# Patient Record
Sex: Female | Born: 1950 | Race: White | Hispanic: No | Marital: Single | Smoking: Never smoker
Health system: Southern US, Community
[De-identification: ages and names within clinical notes are randomized; demographics above are authoritative.]

## PROBLEM LIST (undated history)

## (undated) HISTORY — PX: KNEE SURGERY: SHX244

## (undated) HISTORY — PX: HAND SURGERY: SHX662

---

## 2017-05-25 ENCOUNTER — Emergency Department
Admission: EM | Admit: 2017-05-25 | Discharge: 2017-05-25 | Disposition: A | Discharge status: Home / Self Care | Payer: Medicare Other | Attending: Emergency Medicine | Admitting: Emergency Medicine

## 2017-05-25 ENCOUNTER — Other Ambulatory Visit: Payer: Self-pay

## 2017-05-25 ENCOUNTER — Emergency Department: Payer: Medicare Other

## 2017-05-25 ENCOUNTER — Encounter: Payer: Self-pay | Admitting: Emergency Medicine

## 2017-05-25 DIAGNOSIS — Y999 Unspecified external cause status: Secondary | ICD-10-CM | POA: Insufficient documentation

## 2017-05-25 DIAGNOSIS — W010XXA Fall on same level from slipping, tripping and stumbling without subsequent striking against object, initial encounter: Secondary | ICD-10-CM | POA: Diagnosis not present

## 2017-05-25 DIAGNOSIS — Y92008 Other place in unspecified non-institutional (private) residence as the place of occurrence of the external cause: Secondary | ICD-10-CM | POA: Insufficient documentation

## 2017-05-25 DIAGNOSIS — Y939 Activity, unspecified: Secondary | ICD-10-CM | POA: Insufficient documentation

## 2017-05-25 DIAGNOSIS — S52502A Unspecified fracture of the lower end of left radius, initial encounter for closed fracture: Secondary | ICD-10-CM | POA: Diagnosis not present

## 2017-05-25 DIAGNOSIS — S6992XA Unspecified injury of left wrist, hand and finger(s), initial encounter: Secondary | ICD-10-CM | POA: Diagnosis present

## 2017-05-25 NOTE — ED Triage Notes (Signed)
Pt tripped on broom handle today and fell. Wrist deformity.

## 2017-05-25 NOTE — ED Notes (Signed)
[facility]

## 2017-05-25 NOTE — ED Notes (Signed)
Pt being transported to Dr Hyacinth MeekerMiller office by hospital courtesy car

## 2017-05-25 NOTE — ED Provider Notes (Signed)
[facility] Emergency Department Provider Note  ____________________________________________  Time seen: Approximately 11:50 AM  I have reviewed the triage vital signs and the nursing notes.   HISTORY  Chief Complaint Wrist Pain    HPI Royetta CrochetSuzy Obenchain is a 66 y.o. female who reports a trip and fall in her garage today. She fell onto her outstretched left hand resulting in severe pain right away. No other injuries. No paresthesias or weakness or coldness in the fingertips.     History reviewed. No pertinent past medical history.   There are no active problems to display for this patient.    Past Surgical History:  Procedure Laterality Date  . HAND SURGERY    . KNEE SURGERY       Prior to Admission medications   Not on File  None   Allergies Patient has no known allergies.   History reviewed. No pertinent family history.  Social History Social History   Tobacco Use  . Smoking status: Never Smoker  . Smokeless tobacco: Never Used  Substance Use Topics  . Alcohol use: Yes  . Drug use: No    Review of Systems  Constitutional:   No fever or chills.  ENT:   No sore throat. No rhinorrhea. Cardiovascular:   No chest pain or syncope. Respiratory:   No dyspnea or cough. Gastrointestinal:   Negative for abdominal pain, vomiting and diarrhea.  Musculoskeletal:   Left wrist pain as above All other systems reviewed and are negative except as documented above in ROS and HPI.  ____________________________________________   PHYSICAL EXAM:  VITAL SIGNS: ED Triage Vitals  Enc Vitals Group     BP 05/25/17 0939 (!) 133/108     Pulse Rate 05/25/17 0939 66     Resp 05/25/17 0939 18     Temp 05/25/17 0939 (!) 97.5 F (36.4 C)     Temp Source 05/25/17 0939 Oral     SpO2 05/25/17 0939 100 %     Weight 05/25/17 0937 142 lb (64.4 kg)     Height 05/25/17 0937 5\' 4"  (1.626 m)     Head Circumference --      Peak Flow --      Pain Score  05/25/17 0937 10     Pain Loc --      Pain Edu? --      Excl. in GC? --     Vital signs reviewed, nursing assessments reviewed.   Constitutional:   Alert and oriented. Well appearing and in no distress. Eyes:   No scleral icterus.  EOMI. Marland Kitchen. ENT   Head:   Normocephalic and atraumatic.   Nose:   No congestion/rhinnorhea.    Mouth/Throat:   MMM, no pharyngeal erythema. No peritonsillar mass.    Neck:   No meningismus. Full ROM. Hematological/Lymphatic/Immunilogical:   No cervical lymphadenopathy. Musculoskeletal:   Normal range of motion in all extremities. No joint effusions.  No lower extremity tenderness.  No edema. Neurologic:   Normal speech and language.  Motor grossly intact. No gross focal neurologic deficits are appreciated.  Skin:    Skin is warm, dry and intact. No rash noted.  No petechiae, purpura, or bullae. No lacerations  ____________________________________________    LABS (pertinent positives/negatives) (all labs ordered are listed, but only abnormal results are displayed) Labs Reviewed - No data to display ____________________________________________   EKG    ____________________________________________    RADIOLOGY  Dg Wrist Complete Left  Result Date: 05/25/2017 CLINICAL DATA:  Left wrist  pain, deformity and bruising after falling today in her garage. EXAM: LEFT WRIST - COMPLETE 3+ VIEW COMPARISON:  None. FINDINGS: Mildly comminuted impacted distal radial metaphysis fracture with dorsal angulation and mild dorsal displacement of the distal fragment. The distal ulna is intact. Fixation screw in the thumb, bridging the interphalangeal joint. IMPRESSION: Mildly comminuted and impacted distal radial metaphysis fracture, as described above. No intra-articular extension seen. Electronically Signed   By: Beckie SaltsSteven  Reid M.D.   On: 05/25/2017 10:19    ____________________________________________   PROCEDURES Procedures SPLINT  APPLICATION Date/Time: 11:54 AM Authorized by: Scotty CourtSTAFFORD, Aymen Widrig Consent: Verbal consent obtained. Risks and benefits: risks, benefits and alternatives were discussed Consent given by: patient Splint applied by: orthopedic technician Location details: left forearm Splint type: volar Supplies used: orthoglass Post-procedure: The splinted body part was neurovascularly unchanged following the procedure. Patient tolerance: Patient tolerated the procedure well with no immediate complications.    ____________________________________________    CLINICAL IMPRESSION / ASSESSMENT AND PLAN / ED COURSE  Pertinent labs & imaging results that were available during my care of the patient were reviewed by me and considered in my medical decision making (see chart for details).     Clinical Course as of May 25 1149  Thu May 25, 2017  [id number] Clinical left distal forearm fx. Offered pain meds, pt declines at this time until after xray helps determine nature of injury.  [PS]    Clinical Course User Index [PS] Sharman CheekStafford, Amear Strojny, MD     ----------------------------------------- 11:54 AM on 05/25/2017 -----------------------------------------  Found to have radial metaphysis fracture distally. Mildly comminuted. Discussed with Dr. Hyacinth MeekerMiller of orthopedics, recommends volar splint, will see in clinic at 1 PM today for reduction and more definitive management. Initial fracture management provided for now, clinic information provided to patient.  ____________________________________________   FINAL CLINICAL IMPRESSION(S) / ED DIAGNOSES    Final diagnoses:  Closed fracture of distal end of left radius, unspecified fracture morphology, initial encounter      This SmartLink is deprecated. Use AVSMEDLIST instead to display the medication list for a patient.   Portions of this note were generated with dragon dictation software. Dictation errors may occur despite best attempts at  proofreading.    Sharman CheekStafford, Joshia Kitchings, MD 05/25/17 1155

## 2017-05-25 NOTE — ED Notes (Signed)
Offered to put IV for pain meds. Pt declined at this time and does not want pain medication.

## 2018-07-25 IMAGING — DX DG WRIST COMPLETE 3+V*L*
4 series · 4 of 4 positions shown · non-contrast
Comparison: None.

CLINICAL DATA: Left wrist pain, deformity and bruising after
falling today in her garage.

EXAM:
LEFT WRIST - COMPLETE 3+ VIEW

[wrist ap (1 of 2)]
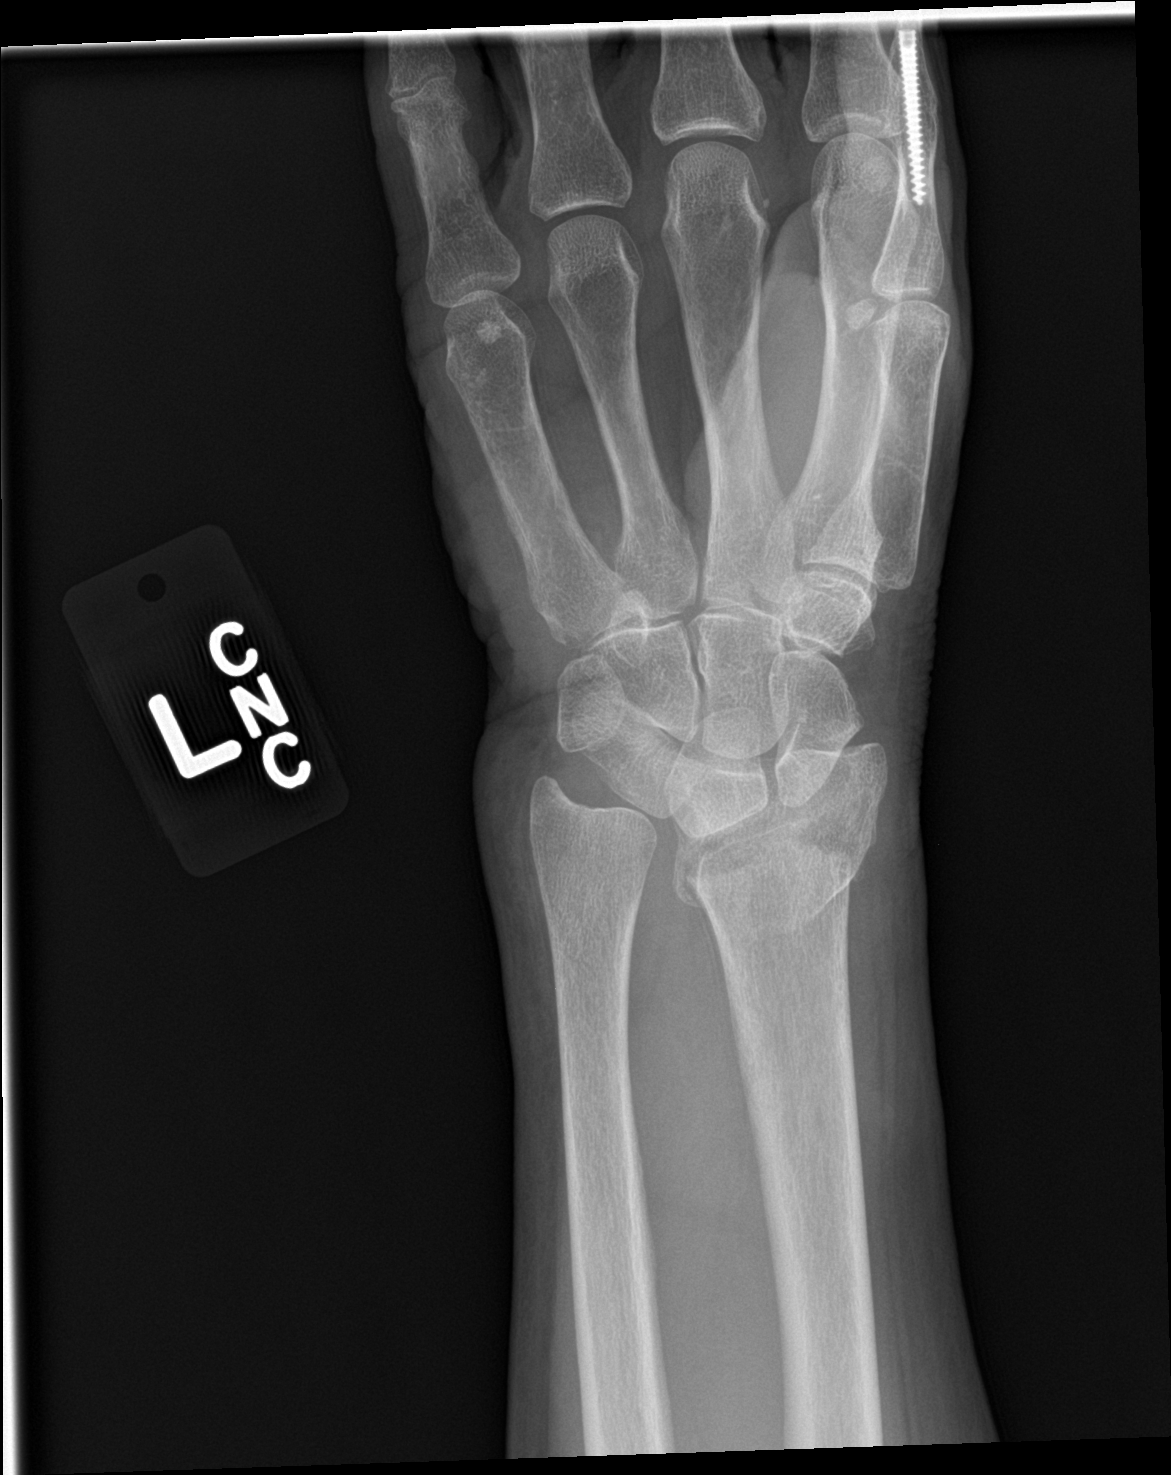

[wrist obl]
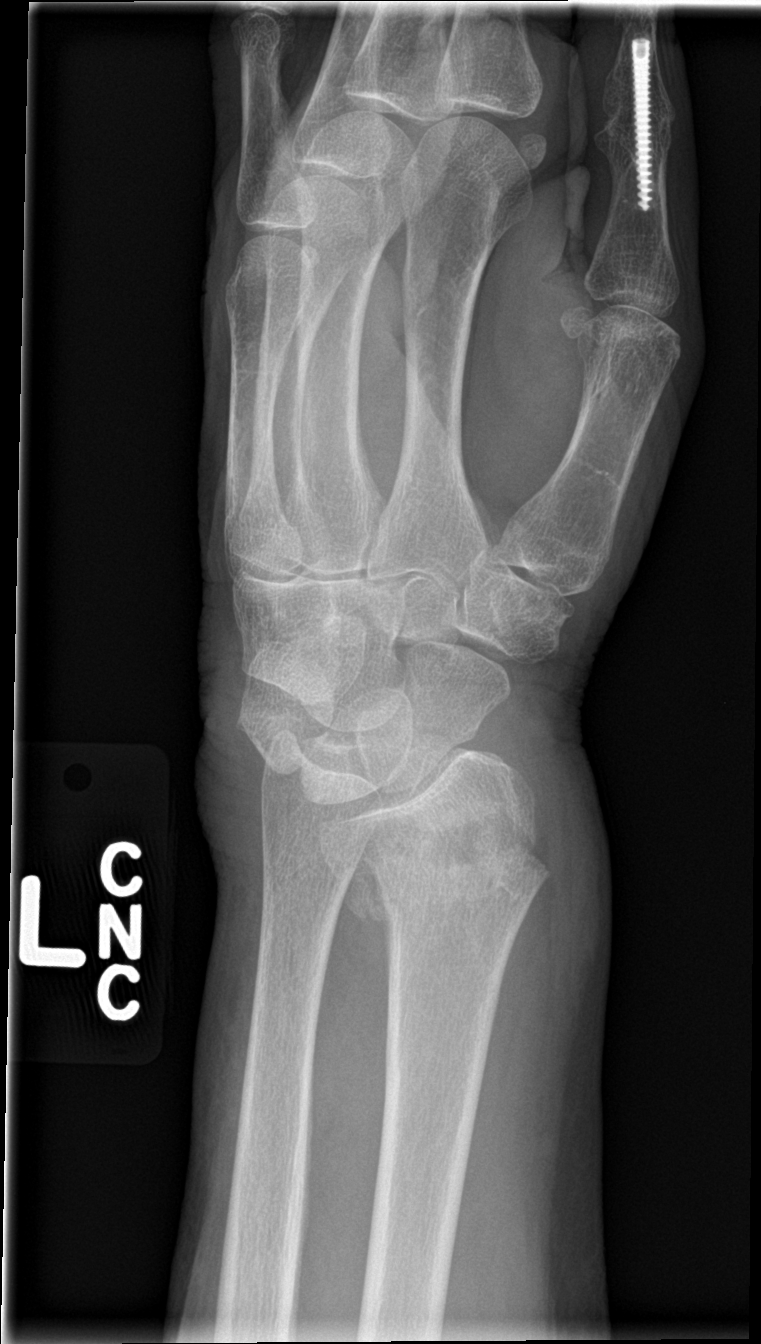

[wrist lat]
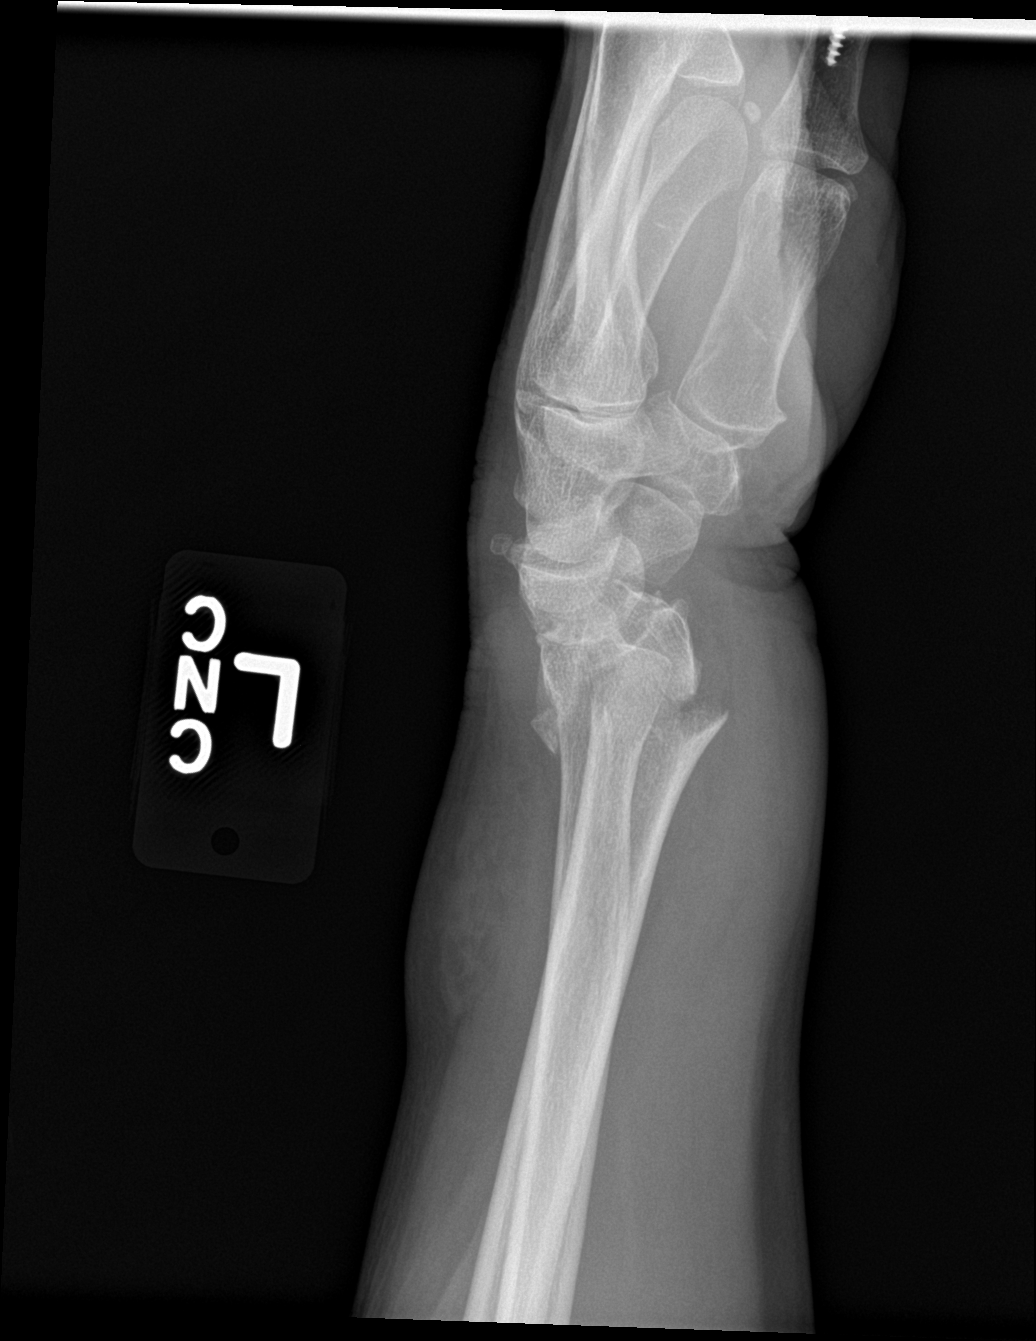

[wrist ap (2 of 2)]
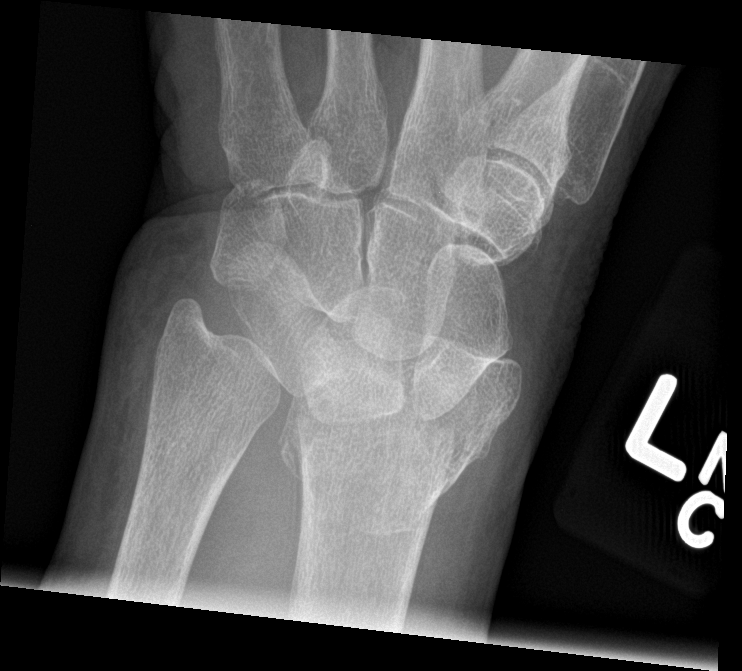

[4 of 4 positions shown; findings below may reference images not displayed]

FINDINGS: Mildly comminuted impacted distal radial metaphysis fracture with
dorsal angulation and mild dorsal displacement of the distal
fragment. The distal ulna is intact. Fixation screw in the thumb,
bridging the interphalangeal joint.
IMPRESSION: Mildly comminuted and impacted distal radial metaphysis fracture, as
described above. No intra-articular extension seen.

## 2019-04-05 ENCOUNTER — Encounter: Payer: Self-pay | Admitting: Emergency Medicine

## 2019-04-05 ENCOUNTER — Other Ambulatory Visit: Payer: Self-pay

## 2019-04-05 ENCOUNTER — Ambulatory Visit
Admission: EM | Admit: 2019-04-05 | Discharge: 2019-04-05 | Disposition: A | Discharge status: Home / Self Care | Payer: Self-pay | Attending: Family Medicine | Admitting: Family Medicine

## 2019-04-05 DIAGNOSIS — M654 Radial styloid tenosynovitis [de Quervain]: Secondary | ICD-10-CM

## 2019-04-05 MED ORDER — MELOXICAM 7.5 MG PO TABS: 7.5000 mg | ORAL_TABLET | Freq: Every day | ORAL | 0 refills | Status: AC

## 2019-04-05 NOTE — ED Triage Notes (Signed)
Patient c/o pain in her right hand and right thumb that started on Tuesday.  Patient states that she had previously worked in her yard prior to the pain.

## 2019-04-05 NOTE — ED Provider Notes (Signed)
[facility]    CSN: [id number] Arrival date & time: 04/05/19  1040      History   Chief Complaint Chief Complaint  Patient presents with  . Hand Pain    right    HPI Cynthia Eaton is a 68 y.o. female.   HPI  68 year old female presents with right hand dominant pain over the distal radius.  She has no history of injury that she is aware of.  States that recently she has been more aggressive with  weights in the pool for exercise working in her yard picking up debris with her hands a day before the pain started which was 3 days prior to this visit.  States that she has restricted range of motion particularly with extension of her wrist and is very painful to use her thumb.  She relates a small bruise on the volar aspect of her distal radius prior to pain starting at there is not present today.  He has no numbness or tingling.  She has been using heat and ice on the area without effect.  She had previously broken her left wrist in the past.      History reviewed. No pertinent past medical history.  There are no active problems to display for this patient.   Past Surgical History:  Procedure Laterality Date  . HAND SURGERY    . KNEE SURGERY      OB History   No obstetric history on file.      Home Medications    Prior to Admission medications   Medication Sig Start Date End Date Taking? Authorizing Provider  meloxicam (MOBIC) 7.5 MG tablet Take 1 tablet (7.5 mg total) by mouth daily. 04/05/19   Lutricia Feil, PA-C    Family History History reviewed. No pertinent family history.  Social History Social History   Tobacco Use  . Smoking status: Never Smoker  . Smokeless tobacco: Never Used  Substance Use Topics  . Alcohol use: Yes  . Drug use: No     Allergies   Tramadol   Review of Systems Review of Systems  Constitutional: Positive for activity change. Negative for appetite change, chills, diaphoresis, fatigue and fever.   Musculoskeletal: Positive for arthralgias and myalgias.  Neurological: Negative for numbness.  All other systems reviewed and are negative.    Physical Exam Triage Vital Signs ED Triage Vitals  Enc Vitals Group     BP 04/05/19 1056 (!) 142/78     Pulse Rate 04/05/19 1056 79     Resp 04/05/19 1056 16     Temp 04/05/19 1056 97.7 F (36.5 C)     Temp Source 04/05/19 1056 Oral     SpO2 04/05/19 1056 100 %     Weight 04/05/19 1053 149 lb (67.6 kg)     Height 04/05/19 1053 5\' 3"  (1.6 m)     Head Circumference --      Peak Flow --      Pain Score 04/05/19 1053 8     Pain Loc --      Pain Edu? --      Excl. in GC? --    No data found.  Updated Vital Signs BP (!) 142/78 (BP Location: Left Arm)   Pulse 79   Temp 97.7 F (36.5 C) (Oral)   Resp 16   Ht 5\' 3"  (1.6 m)   Wt 149 lb (67.6 kg)   SpO2 100%   BMI 26.39 kg/m   Visual Acuity Right  Eye Distance:   Left Eye Distance:   Bilateral Distance:    Right Eye Near:   Left Eye Near:    Bilateral Near:     Physical Exam Vitals signs and nursing note reviewed.  Constitutional:      General: She is not in acute distress.    Appearance: Normal appearance. She is normal weight. She is not ill-appearing, toxic-appearing or diaphoretic.  HENT:     Head: Normocephalic and atraumatic.     Nose: Nose normal.     Mouth/Throat:     Mouth: Mucous membranes are moist.  Eyes:     Conjunctiva/sclera: Conjunctivae normal.     Pupils: Pupils are equal, round, and reactive to light.  Neck:     Musculoskeletal: Normal range of motion and neck supple.  Cardiovascular:     Rate and Rhythm: Normal rate and regular rhythm.  Pulmonary:     Effort: Pulmonary effort is normal.     Breath sounds: Normal breath sounds.  Musculoskeletal:     Comments: Examination of the right dominant hand shows no swelling ecchymosis or erythema present.  Patient has good pulses at the wrist and good capillary refill distally.  Her sensation is normal.  She  has no tenderness of the distal thumb no tenderness of the IP joint or of the CMP joint.  She has a negative grind test.  Maximum tenderness is over the tip of the radial styloid which reproduces her pain.  She has a positive Finkelstein's test.  There is limited range of motion particularly to extension of only 10 degrees flexion is improved to 45 degrees.  She has full pronation and supination.  She has no findings at the elbow or shoulder.  Skin:    General: Skin is warm and dry.  Neurological:     General: No focal deficit present.     Mental Status: She is alert and oriented to person, place, and time.  Psychiatric:        Mood and Affect: Mood normal.        Behavior: Behavior normal.        Thought Content: Thought content normal.        Judgment: Judgment normal.      UC Treatments / Results  Labs (all labs ordered are listed, but only abnormal results are displayed) Labs Reviewed - No data to display  EKG   Radiology No results found.  Procedures Procedures (including critical care time)  Medications Ordered in UC Medications - No data to display  Initial Impression / Assessment and Plan / UC Course  I have reviewed the triage vital signs and the nursing notes.  Pertinent labs & imaging results that were available during my care of the patient were reviewed by me and considered in my medical decision making (see chart for details).  Patient preferred not to have x-rays due to cost considerations.  I told her this is most likely represents a Programmer, applicationsDe Quervain's tenosynovitis.  We will place her into a radial gutter splint for 1 week.  Have also given her 7.5 mg that she will take daily with food.  After a week of full-time use of the splint she may start removing the splint and taking the thumb through a range of motion.  If she is not improving I have given her the name of an orthopedic surgeon.  She has used Dr. Hyacinth MeekerMiller in the past who she will likely follow-up if it is not  improving.  I told her other options may include an injection of a steroid into the tendon or eventually surgery if resistant to conservative treatment.    Final Clinical Impressions(s) / UC Diagnoses   Final diagnoses:  De Quervain's tenosynovitis, right     Discharge Instructions     Apply ice 20 minutes out of every 2 hours 4-5 times daily for comfort.  Take meloxicam with food.  If you are not improving follow-up with orthopedic surgery in 2 weeks.   ED Prescriptions    Medication Sig Dispense Auth. Provider   meloxicam (MOBIC) 7.5 MG tablet Take 1 tablet (7.5 mg total) by mouth daily. 30 tablet Lorin Picket, PA-C     PDMP not reviewed this encounter.   Lorin Picket, PA-C 04/05/19 1202

## 2019-04-05 NOTE — Discharge Instructions (Addendum)
Apply ice 20 minutes out of every 2 hours 4-5 times daily for comfort.  Take meloxicam with food.  If you are not improving follow-up with orthopedic surgery in 2 weeks.  Wear the splint full-time for the first week and may remove for short period times to move your thumb after the first week.

## 2020-02-26 ENCOUNTER — Encounter: Payer: Self-pay | Admitting: Emergency Medicine

## 2020-02-26 ENCOUNTER — Other Ambulatory Visit: Payer: Self-pay

## 2020-02-26 ENCOUNTER — Ambulatory Visit
Admission: EM | Admit: 2020-02-26 | Discharge: 2020-02-26 | Disposition: A | Discharge status: Home / Self Care | Payer: Medicare Other | Attending: Emergency Medicine | Admitting: Emergency Medicine

## 2020-02-26 ENCOUNTER — Ambulatory Visit (INDEPENDENT_AMBULATORY_CARE_PROVIDER_SITE_OTHER): Payer: Medicare Other

## 2020-02-26 DIAGNOSIS — S40021A Contusion of right upper arm, initial encounter: Secondary | ICD-10-CM | POA: Diagnosis present

## 2020-02-26 DIAGNOSIS — W19XXXA Unspecified fall, initial encounter: Secondary | ICD-10-CM | POA: Diagnosis not present

## 2020-02-26 DIAGNOSIS — M25461 Effusion, right knee: Secondary | ICD-10-CM | POA: Diagnosis present

## 2020-02-26 DIAGNOSIS — S82044A Nondisplaced comminuted fracture of right patella, initial encounter for closed fracture: Secondary | ICD-10-CM

## 2020-02-26 DIAGNOSIS — T148XXA Other injury of unspecified body region, initial encounter: Secondary | ICD-10-CM

## 2020-02-26 MED ORDER — HYDROCODONE-ACETAMINOPHEN 5-325 MG PO TABS: 1.0000 | ORAL_TABLET | Freq: Four times a day (QID) | ORAL | 0 refills | Status: AC | PRN

## 2020-02-26 MED ORDER — IBUPROFEN 800 MG PO TABS
800.0000 mg | ORAL_TABLET | Freq: Three times a day (TID) | ORAL | 0 refills | Status: AC | PRN
Start: 2020-02-26 — End: 2020-02-29

## 2020-02-26 MED ORDER — ACETAMINOPHEN 500 MG PO TABS
1000.0000 mg | ORAL_TABLET | Freq: Once | ORAL | Status: AC
  Administered 2020-02-26: 14:00:00 1000 mg via ORAL

## 2020-02-26 NOTE — ED Triage Notes (Signed)
Patient states she tripped and fell on the concrete this morning injuring her right leg (knee) and right arm. Patient states she is unable to put any weight on her leg.

## 2020-02-26 NOTE — Discharge Instructions (Addendum)
Rest,ice,elevate, wear knee immobilizer. Call Orthopedics to schedule follow up appt regarding knee cap(patella) fracture. Take ibuprofen for pain/inflammation and Norco for pain not relieved by Ibuprofen.

## 2020-02-26 NOTE — ED Provider Notes (Signed)
[facility]    CSN: [id number] Arrival date & time: 02/26/20  1256      History   Chief Complaint Chief Complaint  Patient presents with  . Fall  . Leg Pain    right  . Arm Pain    right    HPI Cynthia Eaton is a 69 y.o. female.   69 yr old female presents to UC with CC fall ~0830 today onto right knee on concrete, c/o right knee pain difficulty walking. Took ibuprofen 800mg  po for pain, applied ice to knee wrapped knee pad around it and finished work in .Pain is worse. Pt also has bruise to right upper arm. Denies LOC or head injury  The history is provided by the patient. No language interpreter was used.  Fall This is a new problem. The current episode started 3 to 5 hours ago. The problem occurs constantly. The problem has been gradually worsening. Pertinent negatives include no chest pain, no abdominal pain, no headaches and no shortness of breath. The symptoms are aggravated by walking and standing. The symptoms are relieved by ice. She has tried a cold compress and rest for the symptoms. The treatment provided mild relief.  Leg Pain Associated symptoms: no back pain and no fever   Arm Pain Pertinent negatives include no chest pain, no abdominal pain, no headaches and no shortness of breath.    History reviewed. No pertinent past medical history.  Patient Active Problem List   Diagnosis Date Noted  . Closed nondisplaced comminuted fracture of right patella 02/26/2020  . Fall 02/26/2020  . Contusion of right upper arm 02/26/2020  . Abrasion 02/26/2020  . Knee effusion, right 02/26/2020    Past Surgical History:  Procedure Laterality Date  . HAND SURGERY    . KNEE SURGERY      OB History   No obstetric history on file.      Home Medications    Prior to Admission medications   Medication Sig Start Date End Date Taking? Authorizing Provider  HYDROcodone-acetaminophen (NORCO/VICODIN) 5-325 MG tablet Take 1 tablet by mouth every 6 (six)  hours as needed for up to 3 days. 02/26/20 02/29/20  Mando Blatz, 03/02/20, NP  ibuprofen (ADVIL) 800 MG tablet Take 1 tablet (800 mg total) by mouth every 8 (eight) hours as needed for up to 3 days for mild pain or moderate pain. 02/26/20 02/29/20  Magdaleno Lortie, 03/02/20, NP  meloxicam (MOBIC) 7.5 MG tablet Take 1 tablet (7.5 mg total) by mouth daily. 04/05/19   04/07/19, PA-C    Family History History reviewed. No pertinent family history.  Social History Social History   Tobacco Use  . Smoking status: Never Smoker  . Smokeless tobacco: Never Used  Vaping Use  . Vaping Use: Never used  Substance Use Topics  . Alcohol use: Yes  . Drug use: No     Allergies   Tramadol   Review of Systems Review of Systems  Constitutional: Positive for activity change. Negative for fever.  HENT: Negative.   Eyes: Negative.   Respiratory: Negative for shortness of breath.   Cardiovascular: Negative for chest pain.  Gastrointestinal: Negative for abdominal pain.  Endocrine: Negative.   Genitourinary: Negative.   Musculoskeletal: Positive for gait problem, joint swelling and myalgias. Negative for back pain.  Skin: Positive for color change. Negative for wound.  Neurological: Negative for headaches.  Hematological: Negative.   Psychiatric/Behavioral: Negative.   All other systems reviewed and are negative.    Physical  Exam Triage Vital Signs ED Triage Vitals  Enc Vitals Group     BP --      Pulse --      Resp --      Temp --      Temp src --      SpO2 --      Weight 02/26/20 1356 147 lb (66.7 kg)     Height 02/26/20 1356 5\' 3"  (1.6 m)     Head Circumference --      Peak Flow --      Pain Score 02/26/20 1355 9     Pain Loc --      Pain Edu? --      Excl. in GC? --    No data found.  Updated Vital Signs BP 117/80 (BP Location: Right Arm)   Pulse 60   Temp 98 F (36.7 C) (Oral)   Resp 18   Ht 5\' 3"  (1.6 m)   Wt 147 lb (66.7 kg)   SpO2 100%   BMI 26.04 kg/m     Physical Exam Vitals and nursing note reviewed.  Constitutional:      General: She is in acute distress.     Appearance: She is well-developed. She is not ill-appearing or toxic-appearing.  HENT:     Head: Normocephalic.     Right Ear: Tympanic membrane normal.     Left Ear: Tympanic membrane normal.     Nose: Nose normal.     Mouth/Throat:     Pharynx: Uvula midline.  Eyes:     Pupils: Pupils are equal, round, and reactive to light.  Neck:     Trachea: Trachea normal.     Meningeal: Brudzinski's sign and Kernig's sign absent.  Cardiovascular:     Rate and Rhythm: Normal rate and regular rhythm.     Pulses: Normal pulses.  Pulmonary:     Effort: Pulmonary effort is normal.     Breath sounds: Normal breath sounds.  Musculoskeletal:     Right shoulder: No swelling, deformity, effusion, laceration, bony tenderness or crepitus. Normal strength. Normal pulse.       Arms:     Cervical back: Normal range of motion. Muscular tenderness present.     Right knee: Swelling, effusion, ecchymosis and bony tenderness present. Decreased range of motion. Tenderness present over the medial joint line and lateral joint line. Normal pulse.       Legs:  Skin:    General: Skin is warm and dry.     Findings: Ecchymosis present.     Comments: Right upper arm, right knee  Neurological:     General: No focal deficit present.     Mental Status: She is alert and oriented to person, place, and time.     GCS: GCS eye subscore is 4. GCS verbal subscore is 5. GCS motor subscore is 6.     Cranial Nerves: Cranial nerves are intact.     Sensory: Sensation is intact.     Coordination: Coordination is intact.     Gait: Gait abnormal.  Psychiatric:        Attention and Perception: Attention normal.        Mood and Affect: Mood normal.        Speech: Speech normal.        Behavior: Behavior normal.      UC Treatments / Results  Labs (all labs ordered are listed, but only abnormal results are  displayed) Labs Reviewed - No data to  display  EKG   Radiology DG Knee Complete 4 Views Right  Result Date: 02/26/2020 CLINICAL DATA:  Status post fall, pain EXAM: RIGHT KNEE - COMPLETE 4+ VIEW COMPARISON:  None. FINDINGS: Generalized osteopenia. Acute comminuted fracture of the patella without significant displacement or distraction. Large right knee joint effusion. The no other fracture or dislocation. Moderate-severe lateral femorotibial compartment joint space narrowing. Mild medial femorotibial compartment joint space narrowing. 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 IMPRESSION: Acute comminuted fracture of the patella without significant displacement or distraction. Large right knee joint effusion. Electronically Signed   By: Elige Ko   On: 02/26/2020 14:59    Procedures Procedures (including critical care time)  Medications Ordered in UC Medications  acetaminophen (TYLENOL) tablet 1,000 mg (1,000 mg Oral Given 02/26/20 1414)    Initial Impression / Assessment and Plan / UC Course  I have reviewed the triage vital signs and the nursing notes.  Pertinent labs & imaging results that were available during my care of the patient were reviewed by me and considered in my medical decision making (see chart for details).      Final Clinical Impressions(s) / UC Diagnoses   Final diagnoses:  Closed nondisplaced comminuted fracture of right patella, initial encounter  Fall, initial encounter  Contusion of right upper arm, initial encounter  Abrasion  Knee effusion, right     Discharge Instructions     Rest,ice,elevate, wear knee immobilizer. Call Orthopedics to schedule follow up appt regarding knee cap(patella) fracture. Take ibuprofen for pain/inflammation and Norco for pain not relieved by Ibuprofen.     ED Prescriptions    Medication Sig Dispense Auth. Provider   ibuprofen (ADVIL) 800 MG tablet Take 1 tablet (800 mg total)  by mouth every 8 (eight) hours as needed for up to 3 days for mild pain or moderate pain. 9 tablet Eriq Hufford, NP   HYDROcodone-acetaminophen (NORCO/VICODIN) 5-325 MG tablet Take 1 tablet by mouth every 6 (six) hours as needed for up to 3 days. 10 tablet Arnold Kester, Para March, NP     I have reviewed the PDMP during this encounter.   Clancy Gourd, NP 02/26/20 1720

## 2023-02-10 ENCOUNTER — Other Ambulatory Visit: Payer: Self-pay

## 2023-02-10 ENCOUNTER — Ambulatory Visit
Admission: RE | Admit: 2023-02-10 | Discharge: 2023-02-10 | Disposition: A | Discharge status: Home / Self Care | Payer: Medicare Other | Source: Ambulatory Visit | Attending: Family Medicine | Admitting: Family Medicine

## 2023-02-10 VITALS — BP 122/71 | HR 71 | Temp 97.9°F | Resp 18

## 2023-02-10 DIAGNOSIS — S20364A Insect bite (nonvenomous) of middle front wall of thorax, initial encounter: Secondary | ICD-10-CM

## 2023-02-10 DIAGNOSIS — L539 Erythematous condition, unspecified: Secondary | ICD-10-CM | POA: Diagnosis not present

## 2023-02-10 DIAGNOSIS — W57XXXA Bitten or stung by nonvenomous insect and other nonvenomous arthropods, initial encounter: Secondary | ICD-10-CM

## 2023-02-10 MED ORDER — DOXYCYCLINE HYCLATE 100 MG PO CAPS: 100.0000 mg | ORAL_CAPSULE | Freq: Two times a day (BID) | ORAL | 0 refills | Status: AC

## 2023-02-10 NOTE — ED Provider Notes (Signed)
[facility]    CSN: [id number] Arrival date & time: 02/10/23  1350      History   Chief Complaint Chief Complaint  Patient presents with   Tick Removal    Entered by patient    HPI Anjie Arterberry is a 72 y.o. female.   HPI  History from: patient. Ixchel Mcguyer is a 72 y.o. female who reports finding a tick between her breast yesterday.  She tried removing it but was unsuccessful.   Feeling well. No fever. Does report  redness at site of bite. No headaches, n/v, visual changes, extremity edema reported. Ambulatory without difficulty. No OTC treatment.   History reviewed. No pertinent past medical history.  Patient Active Problem List   Diagnosis Date Noted   Closed nondisplaced comminuted fracture of right patella 02/26/2020   Fall 02/26/2020   Contusion of right upper arm 02/26/2020   Abrasion 02/26/2020   Knee effusion, right 02/26/2020    Past Surgical History:  Procedure Laterality Date   HAND SURGERY     KNEE SURGERY      OB History   No obstetric history on file.      Home Medications    Prior to Admission medications   Medication Sig Start Date End Date Taking? Authorizing Provider  doxycycline (VIBRAMYCIN) 100 MG capsule Take 1 capsule (100 mg total) by mouth 2 (two) times daily. 02/10/23  Yes Sephiroth Mcluckie, DO  meloxicam (MOBIC) 7.5 MG tablet Take 1 tablet (7.5 mg total) by mouth daily. 04/05/19   Lutricia Feil, PA-C    Family History History reviewed. No pertinent family history.  Social History Social History   Tobacco Use   Smoking status: Never   Smokeless tobacco: Never  Vaping Use   Vaping status: Never Used  Substance Use Topics   Alcohol use: Yes   Drug use: No     Allergies   Tramadol   Review of Systems Review of Systems : :negative unless otherwise stated in HPI.      Physical Exam Triage Vital Signs ED Triage Vitals  Encounter Vitals Group     BP 02/10/23 1415 122/71     Systolic BP Percentile --       Diastolic BP Percentile --      Pulse Rate 02/10/23 1415 71     Resp 02/10/23 1415 18     Temp 02/10/23 1415 97.9 F (36.6 C)     Temp src --      SpO2 02/10/23 1415 100 %     Weight --      Height --      Head Circumference --      Peak Flow --      Pain Score 02/10/23 1413 0     Pain Loc --      Pain Education --      Exclude from Growth Chart --    No data found.  Updated Vital Signs BP 122/71   Pulse 71   Temp 97.9 F (36.6 C)   Resp 18   SpO2 100%   Visual Acuity Right Eye Distance:   Left Eye Distance:   Bilateral Distance:    Right Eye Near:   Left Eye Near:    Bilateral Near:     Physical Exam  GEN: alert, well appearing female, in no acute distress  EYES: extra occular movements intact, no scleral injection CV: regular rate, well perfused RESP: no increased work of breathing NEURO: alert, oriented, normal coordination  SKIN: warm and dry; very slight 0.5 cm induration of between breasts; normal skin temperature;  visible thorax of tick appreciated  UC Treatments / Results  Labs (all labs ordered are listed, but only abnormal results are displayed) Labs Reviewed - No data to display  EKG   Radiology No results found.  Procedures Procedures (including critical care time)  Medications Ordered in UC Medications - No data to display  Initial Impression / Assessment and Plan / UC Course  I have reviewed the triage vital signs and the nursing notes.  Pertinent labs & imaging results that were available during my care of the patient were reviewed by me and considered in my medical decision making (see chart for details).      Pt is a 72 y.o. female who presents after presumed tick bite.  Overall, Livy is well appearing, well hydrated and in no respiratory distress.  VSS and pt is afebrile. Tick removal with alligator forceps; no longer visible. Prescribed doxycyline for 10 days.   Precautions to monitor for flu-like symptoms or fever along  with any new rashes. Should these develop she will seek follow up.   Reviewed expectations re: course of current medical issues. Questions answered. Outlined signs and symptoms indicating need for more acute intervention. Patient verbalized understanding. After Visit Summary given.   Final Clinical Impressions(s) / UC Diagnoses   Final diagnoses:  Tick bite of middle front wall of thorax, initial encounter  Redness of skin     Discharge Instructions      Stop by the pharmacy to pick up your prescriptions.  See handout on tick bites. Follow up with your primary care provider as needed.      ED Prescriptions     Medication Sig Dispense Auth. Provider   doxycycline (VIBRAMYCIN) 100 MG capsule Take 1 capsule (100 mg total) by mouth 2 (two) times daily. 20 capsule Katha Cabal, DO      PDMP not reviewed this encounter.              Katha Cabal, DO 02/18/23 1449

## 2023-02-10 NOTE — Discharge Instructions (Signed)
Stop by the pharmacy to pick up your prescriptions.  See handout on tick bites. Follow up with your primary care provider as needed.

## 2023-02-10 NOTE — ED Triage Notes (Addendum)
Pt has a tick in skin between breast.PT unsure how long the tick has been in skin. Pt first noticed tick yesterday.
